# Patient Record
Sex: Female | Born: 1975 | Race: White | Hispanic: No | Marital: Married
Health system: Southern US, Community
[De-identification: ages and names within clinical notes are randomized; demographics above are authoritative.]

---

## 2019-09-15 ENCOUNTER — Ambulatory Visit: Payer: Self-pay

## 2019-09-15 ENCOUNTER — Ambulatory Visit: Payer: BC Managed Care – PPO | Admitting: Sports Medicine

## 2019-09-15 ENCOUNTER — Other Ambulatory Visit: Payer: Self-pay

## 2019-09-15 ENCOUNTER — Ambulatory Visit
Admission: RE | Admit: 2019-09-15 | Discharge: 2019-09-15 | Disposition: A | Discharge status: Home / Self Care | Payer: BC Managed Care – PPO | Source: Ambulatory Visit | Attending: Sports Medicine | Admitting: Sports Medicine

## 2019-09-15 VITALS — BP 104/74 | Ht 68.0 in | Wt 135.0 lb

## 2019-09-15 DIAGNOSIS — M7660 Achilles tendinitis, unspecified leg: Secondary | ICD-10-CM

## 2019-09-15 DIAGNOSIS — M545 Low back pain, unspecified: Secondary | ICD-10-CM

## 2019-09-15 DIAGNOSIS — M79604 Pain in right leg: Secondary | ICD-10-CM | POA: Insufficient documentation

## 2019-09-15 DIAGNOSIS — S96812A Strain of other specified muscles and tendons at ankle and foot level, left foot, initial encounter: Secondary | ICD-10-CM | POA: Insufficient documentation

## 2019-09-15 MED ORDER — NITROGLYCERIN 0.2 MG/HR TD PT24: MEDICATED_PATCH | TRANSDERMAL | 1 refills | Status: AC

## 2019-09-15 NOTE — Assessment & Plan Note (Signed)
Concentric exercises Don't stretch Icing Exercises NTG protocol

## 2019-09-15 NOTE — Patient Instructions (Signed)
Do three exercises once or twice daily Heel raises Heel raises seated Step ups  All 3 sets of 15 Hold weights - 20 lbs?  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  Change position of patch within the affected area every 24 hours.  You may experience a headache during the first 1-2 weeks of using the patch, these should subside.  If you experience headaches after beginning nitroglycerin patch treatment, you may take your preferred over the counter pain reliever.  Another side effect of the nitroglycerin patch is skin irritation or rash related to patch adhesive.  Please notify our office if you develop more severe headaches or rash, and stop the patch.  Tendon healing with nitroglycerin patch may require 12 to 24 weeks depending on the extent of injury.  Men should not use if taking Viagra, Cialis, or Levitra.   Do not use if you have migraines or rosacea.

## 2019-09-15 NOTE — Progress Notes (Addendum)
  CC: Left AT pain  Serious runner - 25 mpw or more/ marathons This started 4 weeks ago Did a hard run 2 days later on a long run felt sharp pain and had difficulty finishing Lives in 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  Feet: normal appearance and arch  Ankle: left No visible erythema or swelling. Range of motion is full in all directions. Strength is 5/5 in all directions. Stable lateral and medial ligaments; squeeze test and kleiger test unremarkable; Talar dome nontender; No pain at base of 5th MT; No tenderness over cuboid; No tenderness over N spot or navicular prominence No tenderness on posterior aspects of lateral and medial malleolus No sign of peroneal tendon subluxations; Negative tarsal tunnel tinel's Able to walk 4 steps.  Low back exam No step off No TTP Good SI movement Full flexion and extension Good lat. Bend and rotations SLR to 80 deg  Left Achilles Ultrasound Achilles at insertion is normal with width of 0.41 Tendon norm. 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 Fields, MD  LS XR This was reviewed and changes to lumbar spine are minimal I  called patient and reviewed With ongoing cramping we may do a treatment trial of gabapentin at HS before considering MRI.  KBF 09/16/19

## 2019-09-15 NOTE — Assessment & Plan Note (Signed)
HX of stress fracture in back in 4th grade Wonder about spondy - leading to radiating pain and cramps  Check LS spine films

## 2019-09-27 ENCOUNTER — Ambulatory Visit: Payer: Self-pay | Admitting: Sports Medicine

## 2019-10-25 ENCOUNTER — Other Ambulatory Visit: Payer: Self-pay

## 2019-10-25 MED ORDER — GABAPENTIN 300 MG PO CAPS: 300.0000 mg | ORAL_CAPSULE | Freq: Every day | ORAL | 1 refills | Status: AC

## 2020-09-11 IMAGING — CR DG LUMBAR SPINE 2-3V
3 series · 3 of 3 positions shown · non-contrast
Comparison: None.

CLINICAL DATA: Sciatica

EXAM:
LUMBAR SPINE - 2-3 VIEW

[t l-spine a.p.]
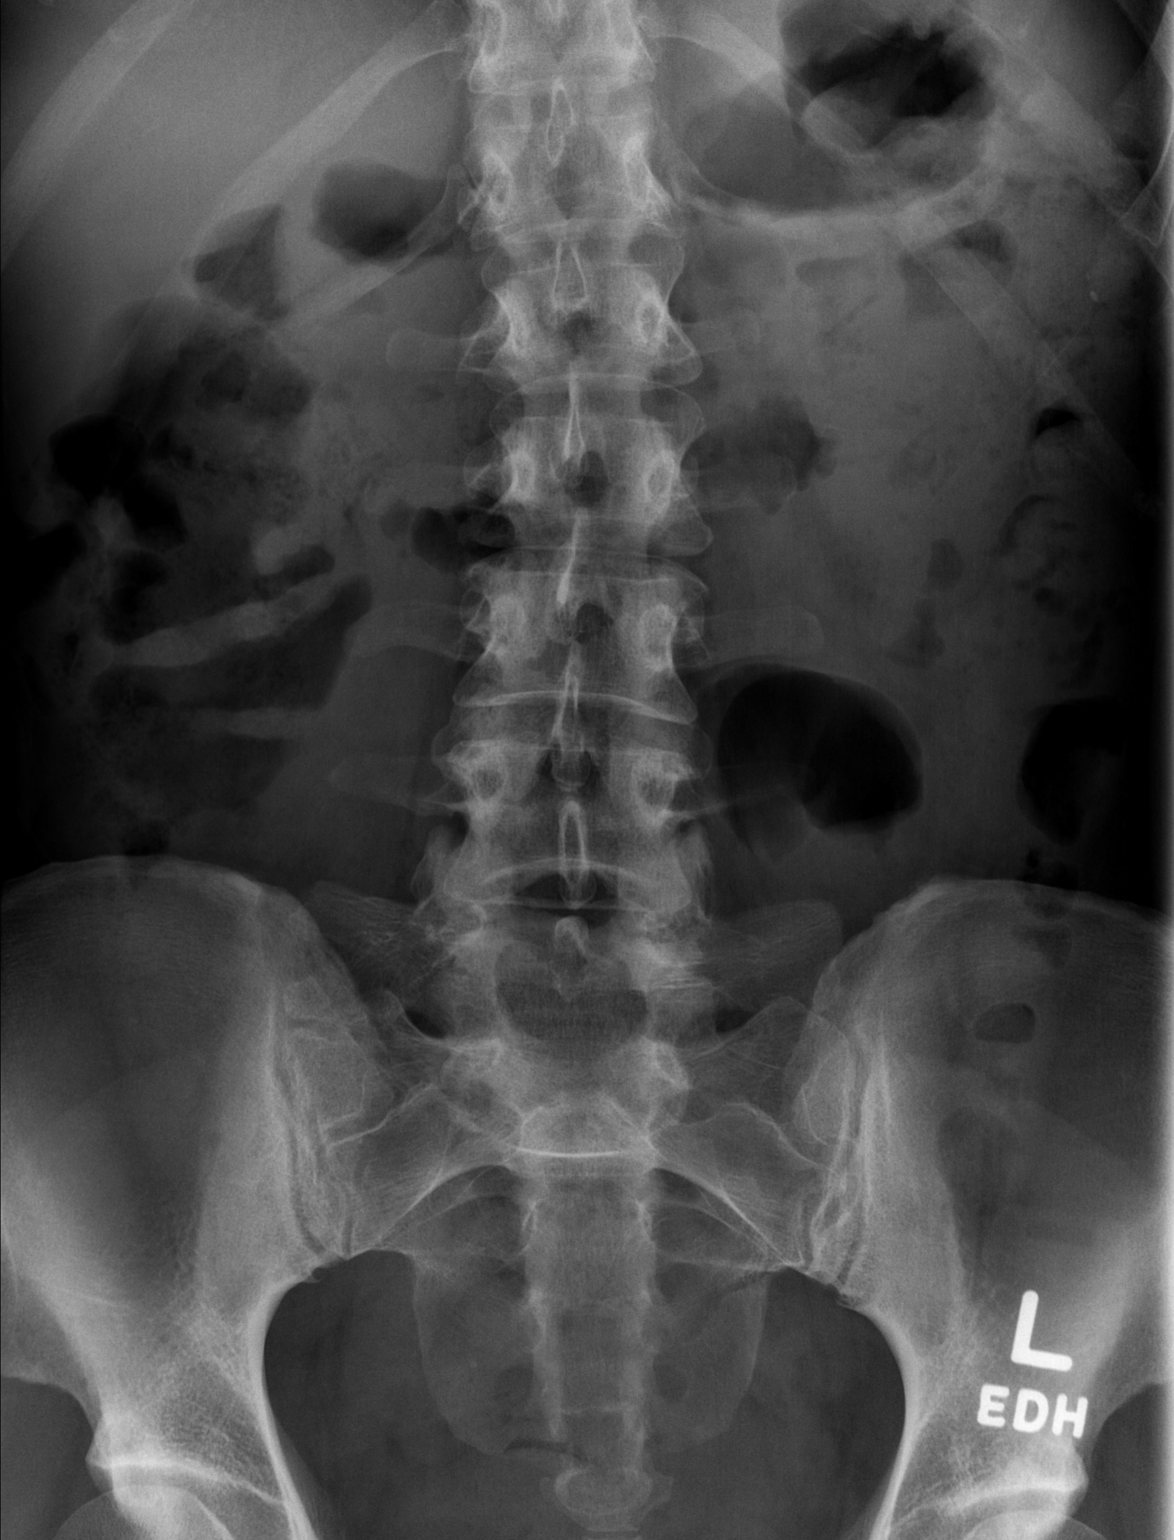

[t l-spine lat]
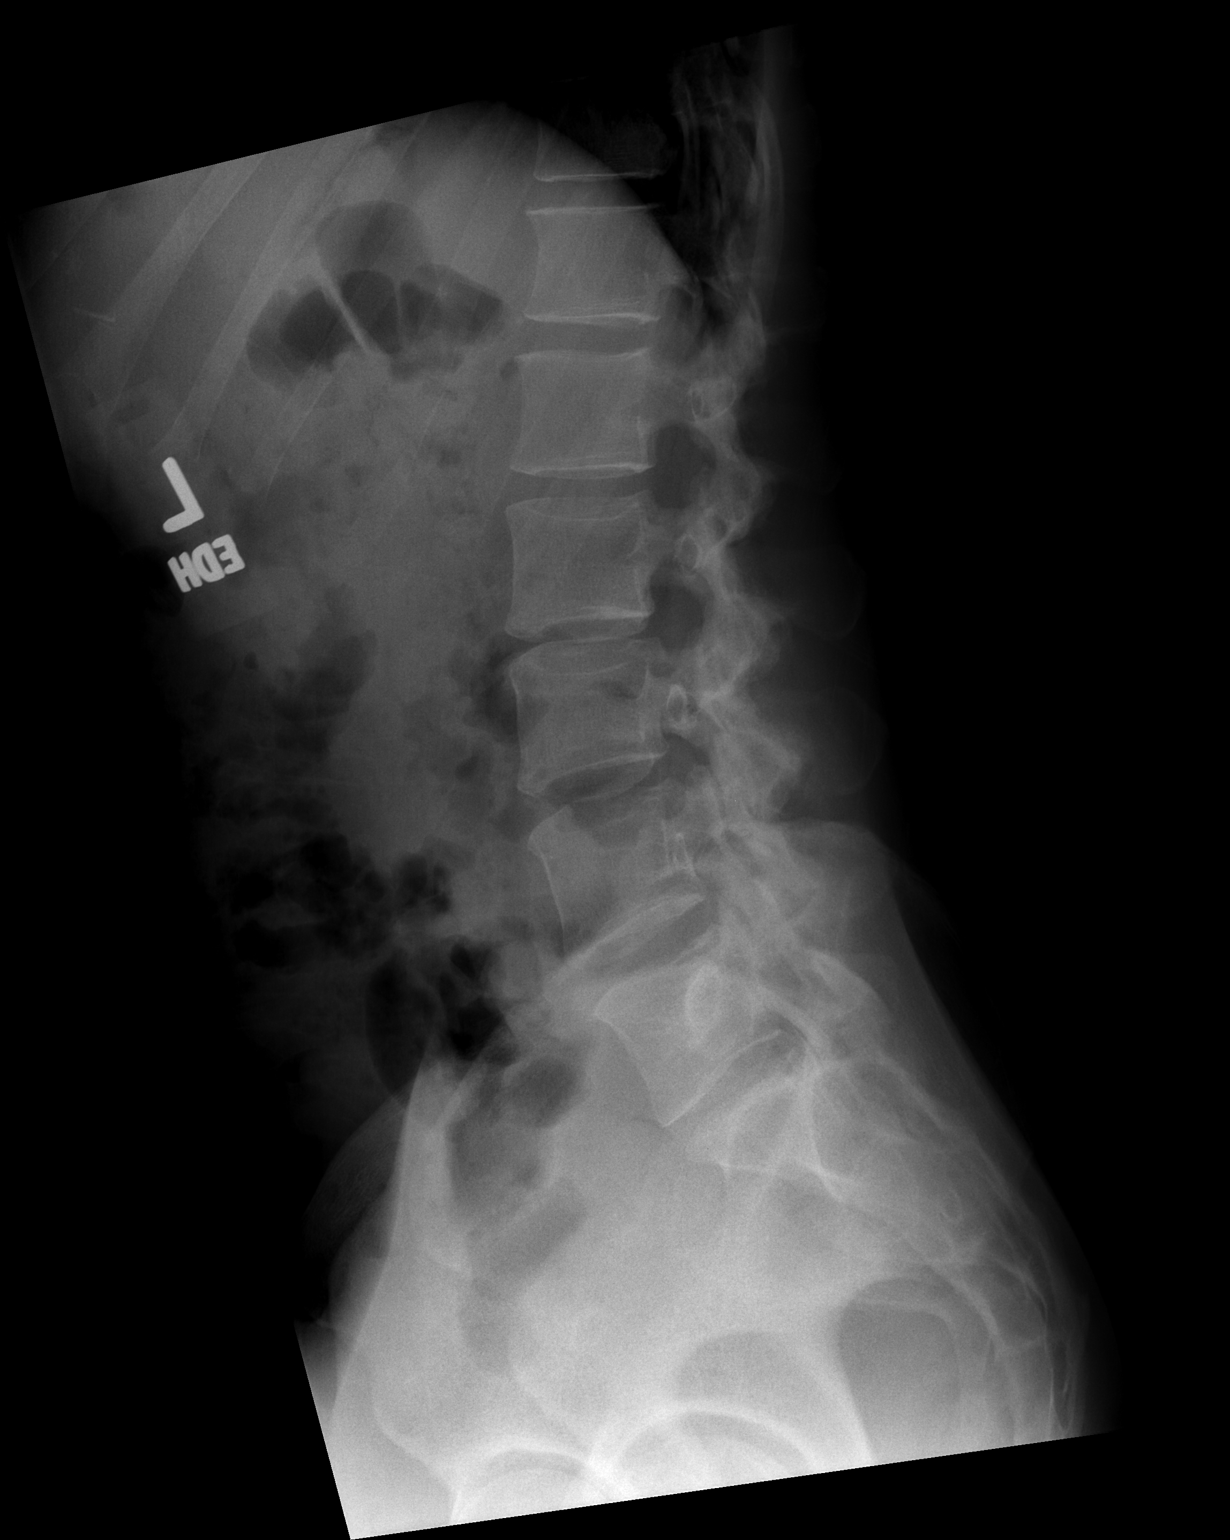

[t l-spine l5-s1 spot]
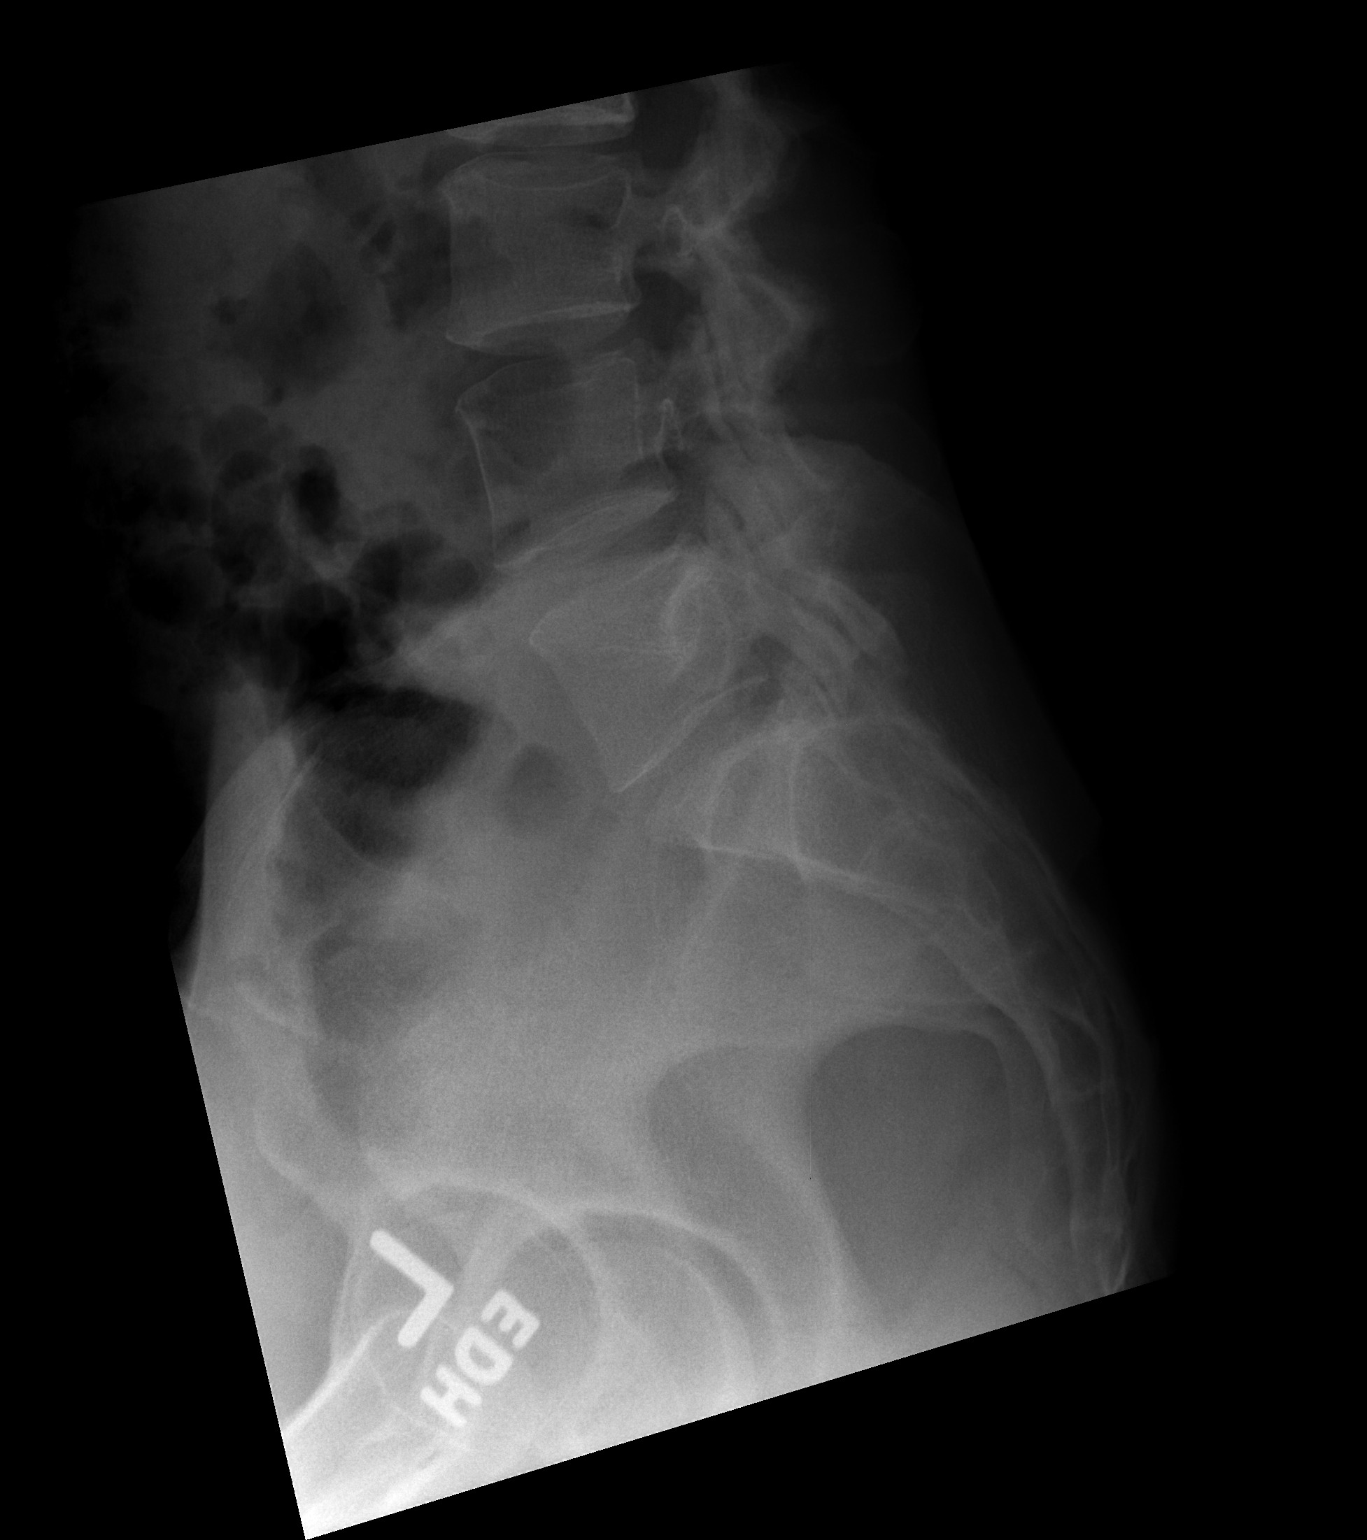

[3 of 3 positions shown; findings below may reference images not displayed]

FINDINGS: No fracture or dislocation of the lumbar spine. Minimal disc space
height loss and osteophytosis throughout. Mild multilevel facet
degenerative change. Nonobstructive pattern of overlying bowel gas.
IMPRESSION: No fracture or dislocation of the lumbar spine. Minimal disc space
height loss and osteophytosis throughout. Mild multilevel facet
degenerative change. Lumbar disc and neural foraminal pathology may
be further evaluated by MRI if indicated by localizing signs and
symptoms.
